# Patient Record
Sex: Male | Born: 1945 | Race: Black or African American | Hispanic: No | Smoking: Never smoker
Health system: Southern US, Community
[De-identification: ages and names within clinical notes are randomized; demographics above are authoritative.]

## PROBLEM LIST (undated history)

## (undated) DIAGNOSIS — M25511 Pain in right shoulder: Secondary | ICD-10-CM

## (undated) DIAGNOSIS — I10 Essential (primary) hypertension: Secondary | ICD-10-CM

## (undated) DIAGNOSIS — N210 Calculus in bladder: Secondary | ICD-10-CM

## (undated) DIAGNOSIS — K219 Gastro-esophageal reflux disease without esophagitis: Secondary | ICD-10-CM

## (undated) DIAGNOSIS — Z87448 Personal history of other diseases of urinary system: Secondary | ICD-10-CM

## (undated) HISTORY — PX: INGUINAL HERNIA REPAIR: SUR1180

## (undated) HISTORY — PX: OTHER SURGICAL HISTORY: SHX169

---

## 2002-01-27 ENCOUNTER — Encounter: Payer: Self-pay | Admitting: Family Medicine

## 2002-01-27 ENCOUNTER — Encounter: Admission: RE | Admit: 2002-01-27 | Discharge: 2002-01-27 | Payer: Self-pay | Admitting: Family Medicine

## 2006-07-30 ENCOUNTER — Encounter: Admission: RE | Admit: 2006-07-30 | Discharge: 2006-07-30 | Payer: Self-pay | Admitting: Occupational Medicine

## 2008-07-19 ENCOUNTER — Encounter: Admission: RE | Admit: 2008-07-19 | Discharge: 2008-07-19 | Payer: Self-pay | Admitting: Internal Medicine

## 2010-04-20 ENCOUNTER — Ambulatory Visit (HOSPITAL_BASED_OUTPATIENT_CLINIC_OR_DEPARTMENT_OTHER): Admission: RE | Admit: 2010-04-20 | Discharge: 2010-04-20 | Payer: Self-pay | Admitting: Urology

## 2010-08-28 LAB — POCT I-STAT, CHEM 8
BUN: 12 mg/dL (ref 6–23)
Calcium, Ion: 1.28 mmol/L (ref 1.12–1.32)
Chloride: 103 mEq/L (ref 96–112)
Creatinine, Ser: 1 mg/dL (ref 0.4–1.5)
Glucose, Bld: 106 mg/dL — ABNORMAL HIGH (ref 70–99)
HCT: 43 % (ref 39.0–52.0)
Hemoglobin: 14.6 g/dL (ref 13.0–17.0)
Potassium: 3.9 mEq/L (ref 3.5–5.1)
Sodium: 142 mEq/L (ref 135–145)
TCO2: 28 mmol/L (ref 0–100)

## 2011-07-19 ENCOUNTER — Other Ambulatory Visit: Payer: Self-pay | Admitting: Internal Medicine

## 2011-07-19 DIAGNOSIS — R7989 Other specified abnormal findings of blood chemistry: Secondary | ICD-10-CM

## 2011-07-26 NOTE — Telephone Encounter (Signed)
Opened in error

## 2012-12-29 ENCOUNTER — Other Ambulatory Visit: Payer: Self-pay | Admitting: Occupational Medicine

## 2012-12-29 ENCOUNTER — Ambulatory Visit: Payer: Self-pay

## 2012-12-29 DIAGNOSIS — Z021 Encounter for pre-employment examination: Secondary | ICD-10-CM

## 2013-06-17 HISTORY — PX: KNEE ARTHROSCOPY: SUR90

## 2014-09-04 IMAGING — CR DG CHEST 2V
2 series · 2 of 2 positions shown · non-contrast
Comparison: 07/19/2008; 07/30/2006

CLINICAL DATA: Physical examination, nonsmoker

CHEST - 2 VIEW

[view not recorded (1 of 2)]
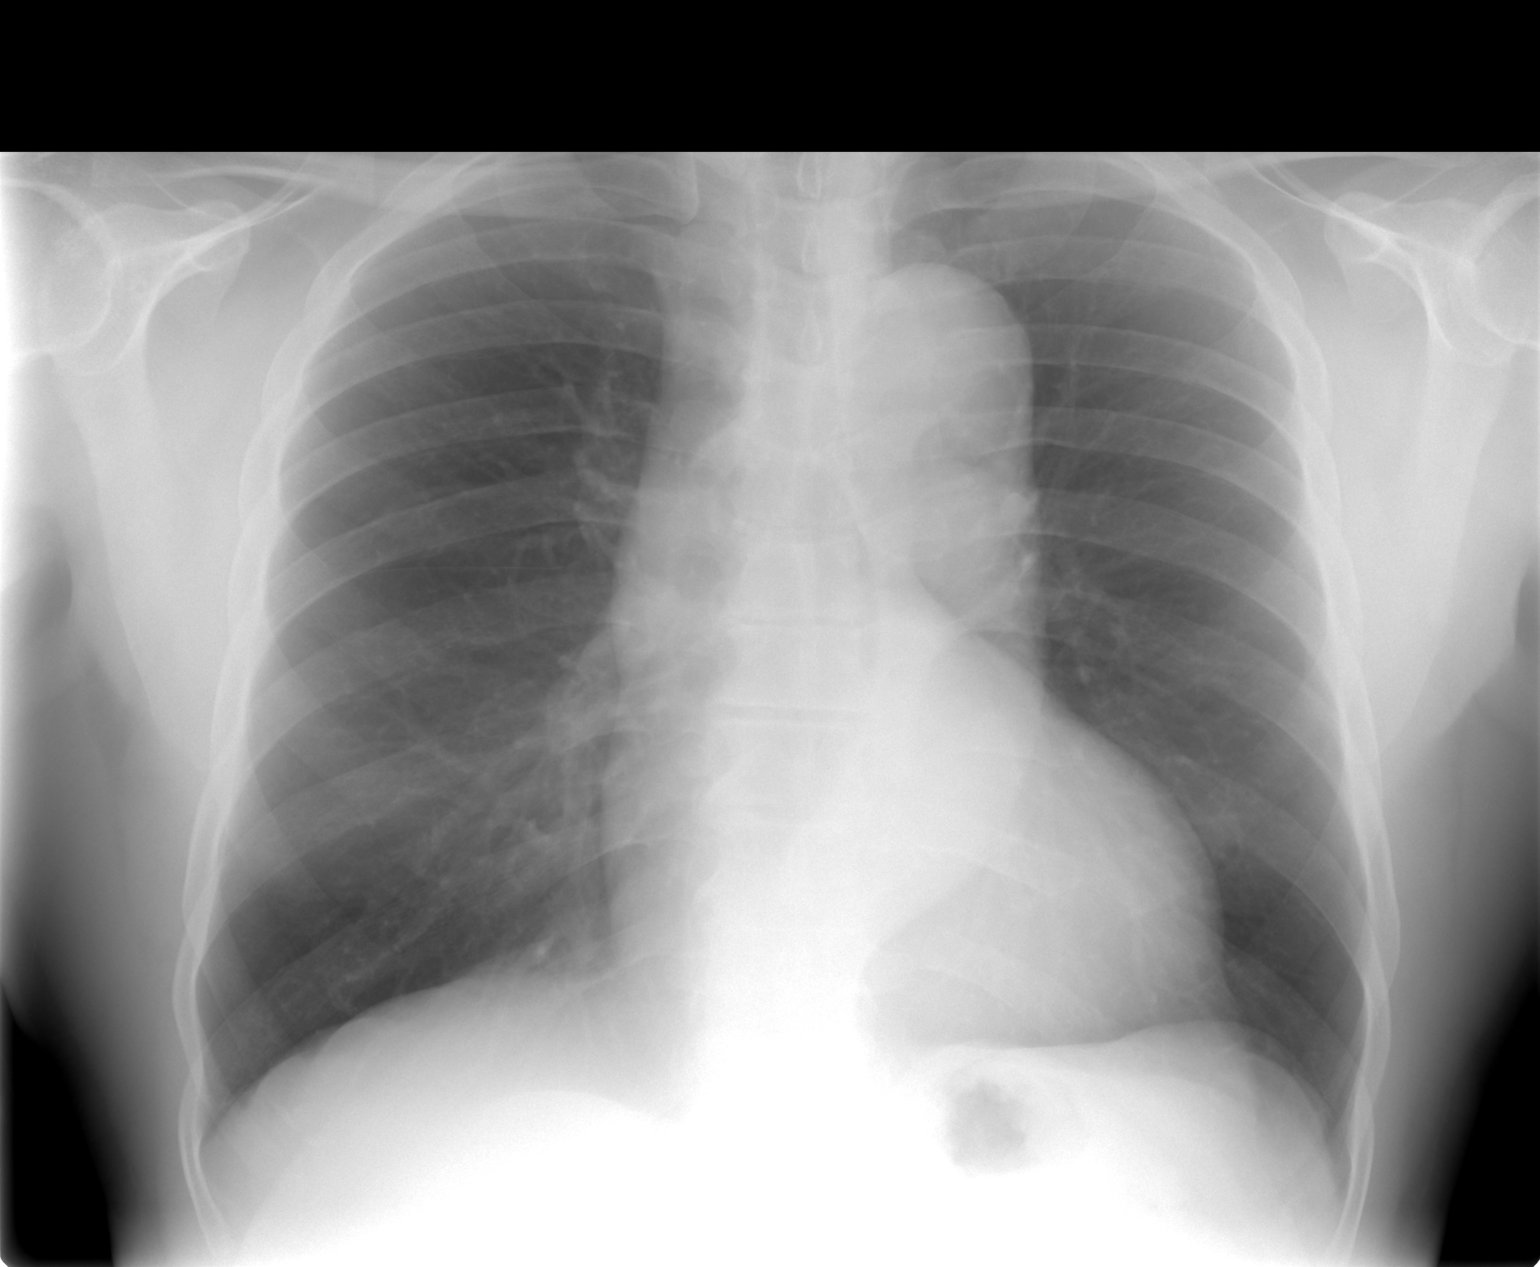

[view not recorded (2 of 2)]
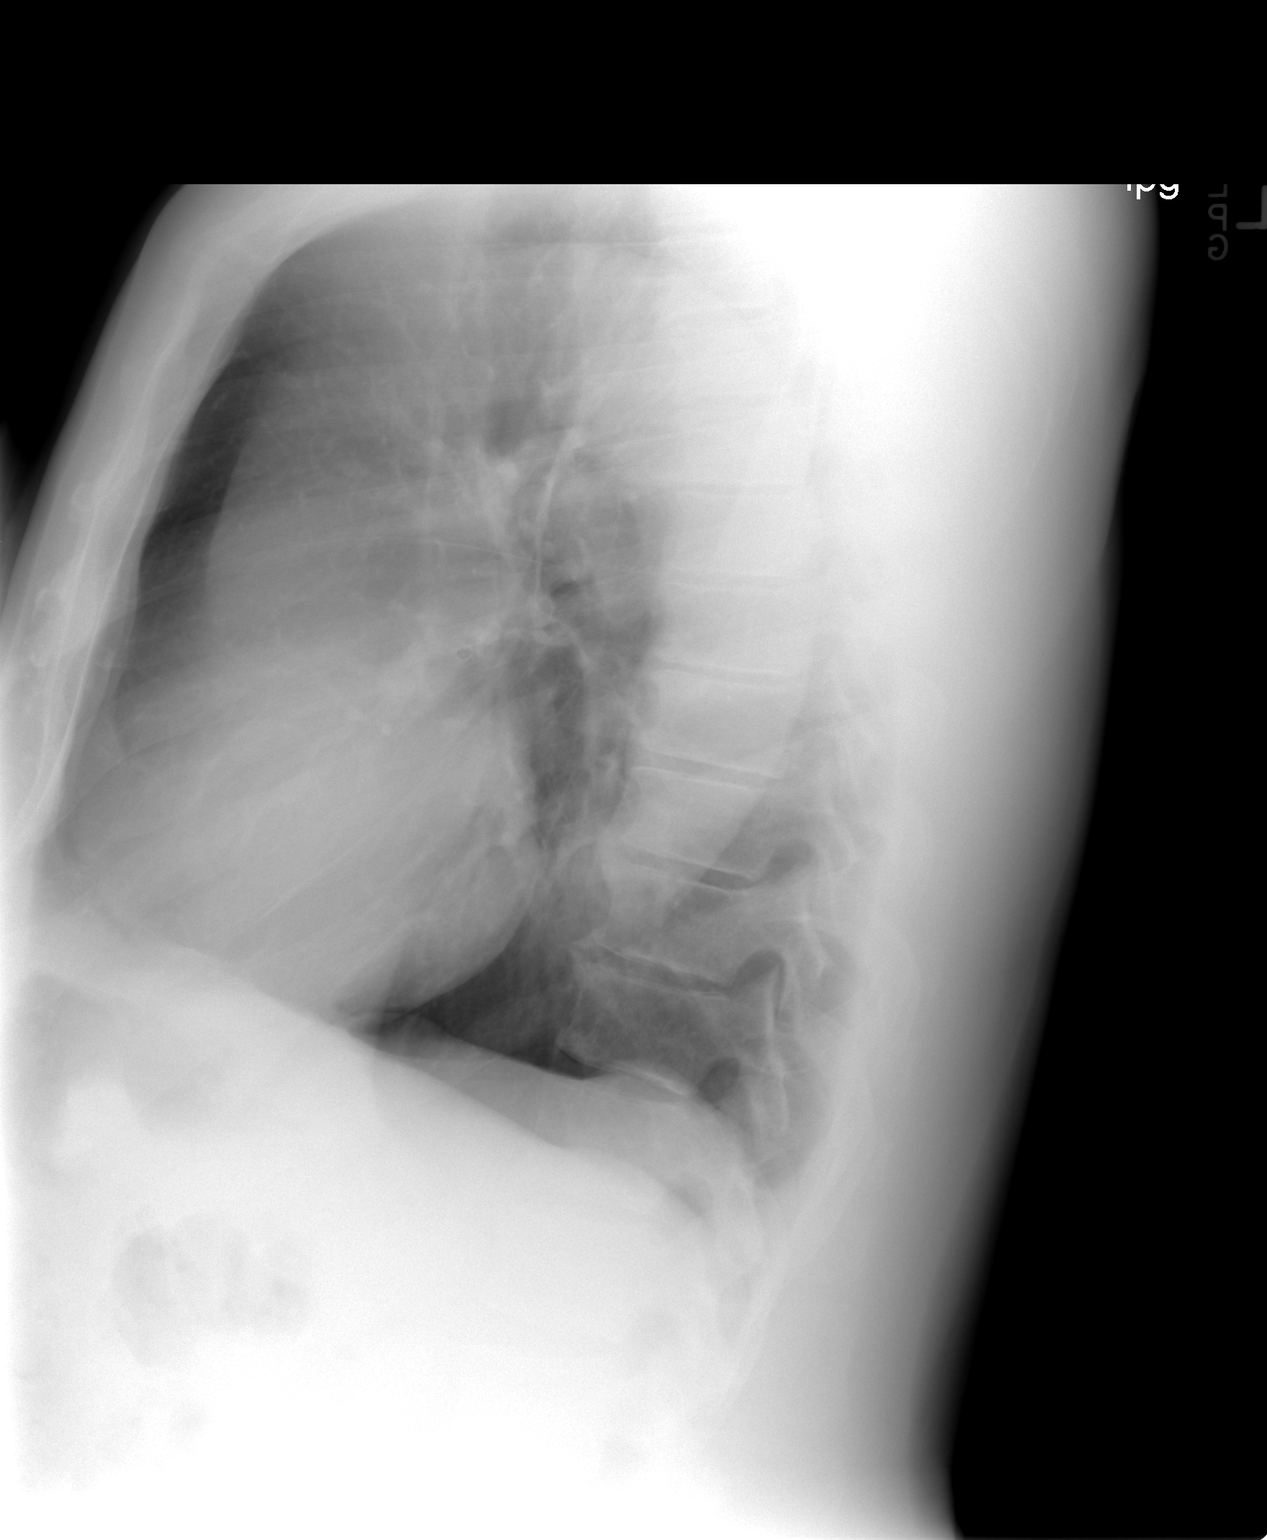

[2 of 2 positions shown; findings below may reference images not displayed]

FINDINGS: Grossly unchanged borderline enlarged cardiac silhouette.
There is grossly unchanged suspected at least moderate ectasia and
tortuosity of the thoracic aorta.

The lungs appear mildly hyperexpanded with flattening of bilateral
hemidiaphragms.  There is mild eventration of the medial aspect of
the right hemidiaphragm and lateral aspect of the left
hemidiaphragm.  No pleural effusion or pneumothorax.  No evidence
of edema.  Unchanged bones.
IMPRESSION: 1.  Grossly unchanged findings of mild cardiomegaly and at least
moderate ectasia and tortuosity of the thoracic aorta.  Further
evaluation for thoracic aortic aneurysm may be performed with a
chest CT as clinically indicated.

2.  Mild lung hyperexpansion without acute cardiopulmonary disease.

These results will be called to the ordering clinician or
representative by the Radiologist Assistant, and communication
documented in the PACS Dashboard.

## 2016-01-16 ENCOUNTER — Emergency Department (HOSPITAL_COMMUNITY)
Admission: EM | Admit: 2016-01-16 | Discharge: 2016-01-16 | Disposition: A | Discharge status: Home / Self Care | Payer: Medicare Other | Attending: Emergency Medicine | Admitting: Emergency Medicine

## 2016-01-16 ENCOUNTER — Encounter (HOSPITAL_COMMUNITY): Payer: Self-pay | Admitting: Emergency Medicine

## 2016-01-16 DIAGNOSIS — T783XXA Angioneurotic edema, initial encounter: Secondary | ICD-10-CM | POA: Insufficient documentation

## 2016-01-16 DIAGNOSIS — I10 Essential (primary) hypertension: Secondary | ICD-10-CM | POA: Insufficient documentation

## 2016-01-16 DIAGNOSIS — Z79899 Other long term (current) drug therapy: Secondary | ICD-10-CM | POA: Insufficient documentation

## 2016-01-16 DIAGNOSIS — Z7982 Long term (current) use of aspirin: Secondary | ICD-10-CM | POA: Diagnosis not present

## 2016-01-16 HISTORY — DX: Essential (primary) hypertension: I10

## 2016-01-16 MED ORDER — FAMOTIDINE IN NACL 20-0.9 MG/50ML-% IV SOLN
20.0000 mg | Freq: Once | INTRAVENOUS | Status: AC
  Administered 2016-01-16: 20 mg via INTRAVENOUS
  Filled 2016-01-16: qty 50

## 2016-01-16 MED ORDER — FAMOTIDINE 20 MG PO TABS: 20.0000 mg | ORAL_TABLET | Freq: Two times a day (BID) | ORAL | 0 refills | Status: AC

## 2016-01-16 MED ORDER — DIPHENHYDRAMINE HCL 50 MG/ML IJ SOLN
25.0000 mg | Freq: Once | INTRAMUSCULAR | Status: AC
  Administered 2016-01-16: 25 mg via INTRAVENOUS
  Filled 2016-01-16: qty 1

## 2016-01-16 MED ORDER — PREDNISONE 20 MG PO TABS: 40.0000 mg | ORAL_TABLET | Freq: Every day | ORAL | 0 refills | Status: DC

## 2016-01-16 MED ORDER — METHYLPREDNISOLONE SODIUM SUCC 125 MG IJ SOLR
125.0000 mg | Freq: Once | INTRAMUSCULAR | Status: AC
  Administered 2016-01-16: 125 mg via INTRAVENOUS
  Filled 2016-01-16: qty 2

## 2016-01-16 NOTE — ED Triage Notes (Signed)
Pt states he woke up this morning and his tongue was swollen  Pt states he does take lisinopril but did not take his dose this morning  Pt states he took benadryl 1 tab this morning prior to coming in  Denies difficulty breathing but states it is a little hard to swallow

## 2016-01-16 NOTE — Discharge Instructions (Signed)
[facility].  Do not take lisinopril.  Please place on your allergy list.

## 2016-01-16 NOTE — ED Provider Notes (Signed)
9:58 AM Patient continues to feel good.  Speaking in full sentences.  Mild ongoing swelling of the tongue.  Patient and family reports that it is not increased in size.  Patient stable for discharge.  Patient be discharged home with prednisone and Pepcid.  I've recommended discontinuation of lisinopril.  Patient is heading to the [facility] at this time for additional recommendations in regards to his blood pressure medications.   Azalia Bilis, MD 01/16/16 [phone]

## 2016-01-16 NOTE — ED Provider Notes (Addendum)
[facility]-EMERGENCY DEPT Provider Note   CSN: [id number] Arrival date & time: 01/16/16  0601  First Provider Contact:  None       History   Chief Complaint Chief Complaint  Patient presents with  . Angioedema    HPI Bryan Williams is a 70 y.o. male.  The history is provided by the patient.  He woke up this morning and noted that his tongue was swollen. There is no difficulty breathing or swallowing. He does have history of hypertension and is on lisinopril. He has been taking that for several years and has not had nay problems prior to this. He's never had any similar episodes in the past. He denies any pain or itching. There is no rash.  Past Medical History:  Diagnosis Date  . Hypertension     There are no active problems to display for this patient.   Past Surgical History:  Procedure Laterality Date  . HERNIA REPAIR         Home Medications    Prior to Admission medications   Not on File    Family History Family History  Problem Relation Age of Onset  . Hypertension Mother   . Hypertension Father   . Diabetes Father     Social History Social History  Substance Use Topics  . Smoking status: Never Smoker  . Smokeless tobacco: Never Used  . Alcohol use Yes     Allergies   Review of patient's allergies indicates no known allergies.   Review of Systems Review of Systems  All other systems reviewed and are negative.    Physical Exam Updated Vital Signs BP 165/97 (BP Location: Left Arm)   Pulse 67   Temp 98.4 F (36.9 C) (Oral)   Resp 16   Ht 5\' 8"  (1.727 m)   Wt 182 lb (82.6 kg)   SpO2 100%   BMI 27.67 kg/m   Physical Exam  Nursing note and vitals reviewed.  70 year old male, resting comfortably and in no acute distress. Vital signs are significant for hypertension. Oxygen saturation is 100%, which is normal. Head is normocephalic and atraumatic. PERRLA, EOMI. Oropharynx shows moderate angioedema of the tongue and sublingual tissue. There  is no edema noted of the uvula or soft palate. Speech is slightly thick of. He has no difficulty with secretions. There is no stridor. Neck is nontender and supple without adenopathy or JVD. Back is nontender and there is no CVA tenderness. Lungs are clear without rales, wheezes, or rhonchi. Chest is nontender. Heart has regular rate and rhythm without murmur. Abdomen is soft, flat, nontender without masses or hepatosplenomegaly and peristalsis is normoactive. Extremities have no cyanosis or edema, full range of motion is present. Skin is warm and dry without rash. Neurologic: Mental status is normal, cranial nerves are intact, there are no motor or sensory deficits.  ED Treatments / Results  Labs (all labs ordered are listed, but only abnormal results are displayed) Labs Reviewed - No data to display  EKG  EKG Interpretation None       Radiology No results found.  Procedures Procedures (including critical care time) CRITICAL CARE Performed by: UTMLY,YTKPT Total critical care time: 60 minutes Critical care time was exclusive of separately billable procedures and treating other patients. Critical care was necessary to treat or prevent imminent or life-threatening deterioration. Critical care was time spent personally by me on the following activities: development of treatment plan with patient and/or surrogate as well as nursing, discussions with  consultants, evaluation of patient's response to treatment, examination of patient, obtaining history from patient or surrogate, ordering and performing treatments and interventions, ordering and review of laboratory studies, ordering and review of radiographic studies, pulse oximetry and re-evaluation of patient's condition.  Medications Ordered in ED Medications - No data to display   Initial Impression / Assessment and Plan / ED Course  I have reviewed the triage vital signs and the nursing notes.  Pertinent labs & imaging results  that were available during my care of the patient were reviewed by me and considered in my medical decision making (see chart for details).  Clinical Course    Angioedema of the tongue secondary to ACE inhibitor. Old records are reviewed, and he has no relevant past visits. He is given methylprednisolone, diphenhydramine, famotidine and will be observed in the ED.   0700 Minimal improvement Case is signed out to Dr. Patria Mane.  Final Clinical Impressions(s) / ED Diagnoses   Final diagnoses:  Angioedema, initial encounter    New Prescriptions New Prescriptions   No medications on file     Dione Booze, MD 01/17/16 1506    Dione Booze, MD 01/17/16 [phone]

## 2016-01-27 ENCOUNTER — Encounter (HOSPITAL_COMMUNITY): Payer: Self-pay | Admitting: Emergency Medicine

## 2016-01-27 ENCOUNTER — Emergency Department (HOSPITAL_COMMUNITY): Payer: Medicare Other

## 2016-01-27 ENCOUNTER — Emergency Department (HOSPITAL_COMMUNITY)
Admission: EM | Admit: 2016-01-27 | Discharge: 2016-01-27 | Disposition: A | Discharge status: Home / Self Care | Payer: Medicare Other | Attending: Emergency Medicine | Admitting: Emergency Medicine

## 2016-01-27 DIAGNOSIS — I10 Essential (primary) hypertension: Secondary | ICD-10-CM | POA: Insufficient documentation

## 2016-01-27 DIAGNOSIS — M79601 Pain in right arm: Secondary | ICD-10-CM | POA: Diagnosis present

## 2016-01-27 DIAGNOSIS — S46211A Strain of muscle, fascia and tendon of other parts of biceps, right arm, initial encounter: Secondary | ICD-10-CM

## 2016-01-27 DIAGNOSIS — S46001A Unspecified injury of muscle(s) and tendon(s) of the rotator cuff of right shoulder, initial encounter: Secondary | ICD-10-CM

## 2016-01-27 DIAGNOSIS — Z7982 Long term (current) use of aspirin: Secondary | ICD-10-CM | POA: Insufficient documentation

## 2016-01-27 DIAGNOSIS — Z79899 Other long term (current) drug therapy: Secondary | ICD-10-CM | POA: Insufficient documentation

## 2016-01-27 DIAGNOSIS — M75101 Unspecified rotator cuff tear or rupture of right shoulder, not specified as traumatic: Secondary | ICD-10-CM | POA: Insufficient documentation

## 2016-01-27 DIAGNOSIS — M66821 Spontaneous rupture of other tendons, right upper arm: Secondary | ICD-10-CM | POA: Diagnosis not present

## 2016-01-27 MED ORDER — KETOROLAC TROMETHAMINE 60 MG/2ML IM SOLN
60.0000 mg | Freq: Once | INTRAMUSCULAR | Status: AC
  Administered 2016-01-27: 60 mg via INTRAMUSCULAR
  Filled 2016-01-27: qty 2

## 2016-01-27 MED ORDER — NAPROXEN 375 MG PO TABS: 375.0000 mg | ORAL_TABLET | Freq: Two times a day (BID) | ORAL | 0 refills | Status: DC

## 2016-01-27 MED ORDER — HYDROCODONE-ACETAMINOPHEN 5-325 MG PO TABS
2.0000 | ORAL_TABLET | Freq: Once | ORAL | Status: AC
  Administered 2016-01-27: 2 via ORAL
  Filled 2016-01-27: qty 2

## 2016-01-27 NOTE — ED Provider Notes (Addendum)
[facility]ERGENC[facility] DEPT Provider Note   CSN: [id number] Arrival date & time: 01/27/16  1821  First Provider Contact:  First MD Initiated Contact with Patient 01/27/16 1926        History   Chief Complaint Chief Complaint  Patient presents with  . Arm Pain    HPI Bryan Williams is a 70 y.o. male.  HPI 70 yo M with PMHx of HTN who p/w a 1-week history of right arm pain. Pt denies any direct injury but does admit to frequent use of his arm in the yard. Over the last week, he noticed initially acute and now gradually worsening onset of right arm pain. He describes the pain as an aching, throbbing sensation worse in his shoulder and upper arm. Over the past day, he has had difficulty flexing his arm or raising his arm due to pain and weakness. He subsequently presents for evaluation. Pain is made worse with movement and palpation. Denies any alleviating factors.  Past Medical History:  Diagnosis Date  . Hypertension     There are no active problems to display for this patient.   Past Surgical History:  Procedure Laterality Date  . HERNIA REPAIR         Home Medications    Prior to Admission medications   Medication Sig Start Date End Date Taking? Authorizing Provider  aspirin EC 81 MG tablet Take 81 mg by mouth daily.   Yes Historical Provider, MD  Cholecalciferol (VITAMIN D PO) Take 1 tablet by mouth daily.    Yes Historical Provider, MD  famotidine (PEPCID) 20 MG tablet Take 1 tablet (20 mg total) by mouth 2 (two) times daily. 01/16/16  Yes Azalia Bilis, MD  GARLIC PO Take 1 capsule by mouth daily.    Yes Historical Provider, MD  Omega-3 Fatty Acids (FISH OIL PO) Take 1 capsule by mouth daily.    Yes Historical Provider, MD  naproxen (NAPROSYN) 375 MG tablet Take 1 tablet (375 mg total) by mouth 2 (two) times daily. 01/27/16   Shaune Pollack, MD  predniSONE (DELTASONE) 20 MG tablet Take 2 tablets (40 mg total) by mouth daily. Patient not taking: Reported on 01/27/2016 01/16/16    Azalia Bilis, MD    Family History Family History  Problem Relation Age of Onset  . Hypertension Mother   . Hypertension Father   . Diabetes Father     Social History Social History  Substance Use Topics  . Smoking status: Never Smoker  . Smokeless tobacco: Never Used  . Alcohol use Yes     Allergies   Lisinopril   Review of Systems Review of Systems  Constitutional: Negative for chills, fatigue and fever.  HENT: Negative for congestion and rhinorrhea.   Eyes: Negative for visual disturbance.  Respiratory: Negative for cough, shortness of breath and wheezing.   Cardiovascular: Negative for chest pain and leg swelling.  Gastrointestinal: Negative for abdominal pain, diarrhea, nausea and vomiting.  Genitourinary: Negative for dysuria and flank pain.  Musculoskeletal: Negative for neck pain and neck stiffness.  Skin: Negative for rash and wound.  Allergic/Immunologic: Negative for immunocompromised state.  Neurological: Negative for syncope, weakness and headaches.  All other systems reviewed and are negative.     Physical Exam Updated Vital Signs BP (!) 176/104 (BP Location: Left Arm)   Pulse 64   Temp 98.6 F (37 C) (Oral)   Resp 14   Ht  (1.727 m)   Wt 187 lb (84.8 kg)   SpO2 99%  BMI 28.43 kg/m   Physical Exam  Constitutional: He is oriented to person, place, and time. He appears well-developed and well-nourished. No distress.  HENT:  Head: Normocephalic and atraumatic.  Eyes: Conjunctivae are normal.  Neck: Neck supple.  Cardiovascular: Normal rate, regular rhythm and normal heart sounds.   Pulmonary/Chest: Effort normal. No respiratory distress. He has no wheezes.  Abdominal: He exhibits no distension.  Musculoskeletal: He exhibits no edema.  Neurological: He is alert and oriented to person, place, and time. He exhibits normal muscle tone.  Skin: Skin is warm. Capillary refill takes less than 2 seconds. No rash noted.  Nursing note and  vitals reviewed.   UPPER EXTREMITY EXAM: RIGHT  INSPECTION & PALPATION: Soft tissue swelling over mid and proximal biceps with TTP over proximal bicipital groove. Distal biceps tendon is intact to palpation and pt able to supinate/pronate at elbow. On ROM testing, pt with difficult flexing at shoulder and flexion at elbow. No impingement. Able to externally and internally rotate humerus at shoulder. No open wounds.  SENSORY: Sensation is intact to light touch in:  Superficial radial nerve distribution (dorsal first web space) Median nerve distribution (tip of index finger)   Ulnar nerve distribution (tip of small finger)     MOTOR:  + Motor posterior interosseous nerve (thumb IP extension) + Anterior interosseous nerve (thumb IP flexion, index finger DIP flexion) + Radial nerve (wrist extension) + Median nerve (palpable firing thenar mass) + Ulnar nerve (palpable firing of first dorsal interosseous muscle)  VASCULAR: 2+ radial pulse Brisk capillary refill < 2 sec, fingers warm and well-perfused  ED Treatments / Results  Labs (all labs ordered are listed, but only abnormal results are displayed) Labs Reviewed - No data to display  EKG  EKG Interpretation None       Radiology Dg Shoulder Right  Result Date: 01/27/2016 CLINICAL DATA:  Proximal right humeral pain for 1 week, no known injury, initial encounter EXAM: RIGHT SHOULDER - 2+ VIEW COMPARISON:  None. FINDINGS: Degenerative changes of the acromioclavicular joint are noted. The humeral head is high-riding which may be related to an under line rotator cuff injury. Bony thorax is within normal limits. IMPRESSION: No acute fracture or dislocation is noted. Humeral head is somewhat high-riding which may be related underlying rotator cuff injury. Electronically Signed   By: Alcide CleverMark  Lukens M.D.   On: 01/27/2016 20:19    Procedures Procedures (including critical care time)  Medications Ordered in ED Medications    HYDROcodone-acetaminophen (NORCO/VICODIN) 5-325 MG per tablet 2 tablet (2 tablets Oral Given 01/27/16 2032)  ketorolac (TORADOL) injection 60 mg (60 mg Intramuscular Given 01/27/16 2032)     Initial Impression / Assessment and Plan / ED Course  I have reviewed the triage vital signs and the nursing notes.  Pertinent labs & imaging results that were available during my care of the patient were reviewed by me and considered in my medical decision making (see chart for details).  Clinical Course   70 yo M with PMHx of HTN who presents with right arm pain and limitation in ROM for 1 week. No direct trauma. Exam is as above and is concerning for partial versus complete proximal biceps tendon rupture. Distal biceps tendon is intact on palpation and ROM testing. DDx includes rotator cuff injury though no impingement and internal/external rotation is largely WNL. No signs of distal NV compromise. Plain films show no fx but high-riding humerus c/w rotator cuff injury. Will place in splint for comfort but  advised ROM to prevent frozen shoulder, and d/c with outpt Ortho f/u this week. NSAIDs for pain.  Of note, pt markedly hypertensive here. He has no HA, vision changes, CP, SOB, or signs of HTN urgency. Per review of records, pt has h/o poorly-controlled HTN. He will f/u with his PCP re: this.  Final Clinical Impressions(s) / ED Diagnoses   Final diagnoses:  Biceps rupture, proximal, right, initial encounter  Rotator cuff injury, right, initial encounter    New Prescriptions Discharge Medication List as of 01/27/2016  8:44 PM    START taking these medications   Details  naproxen (NAPROSYN) 375 MG tablet Take 1 tablet (375 mg total) by mouth 2 (two) times daily., Starting Sat 01/27/2016, Print         Shaune Pollack, MD 01/28/16 4098    Shaune Pollack, MD 01/28/16 [phone]

## 2016-01-27 NOTE — ED Triage Notes (Addendum)
Patient c/o right upper arm pain x week.  Pain worse with movement.  Patient states that arm feels weak at times and pain is throbbing.  Patient denies any lifting moving, or injury.  Patient has equal and strong grips.

## 2016-01-27 NOTE — Discharge Instructions (Signed)
Wear your sling for comfort, but it is important to make sure you move your shoulder through a full range of motion frequently in order to prevent frozen shoulder

## 2016-02-29 ENCOUNTER — Other Ambulatory Visit: Payer: Self-pay | Admitting: Urology

## 2016-03-07 ENCOUNTER — Encounter (HOSPITAL_BASED_OUTPATIENT_CLINIC_OR_DEPARTMENT_OTHER): Payer: Self-pay | Admitting: *Deleted

## 2016-03-07 NOTE — Progress Notes (Signed)
NPO AFTER MN.  ARRIVE AT 0700.  NEED ISTAT AND EKG.  WILL TAKE PEPCID AND NORVASC AM DOS W/ SIPS OF WATER.

## 2016-03-08 NOTE — H&P (Signed)
HPI: Bryan Williams is a 70 year-old male patient who was referred by Dr. Delorse Lek, MD who was seen for evaluation of BPH and lower tract symptoms.  The patient complains of lower urinary tract symptom(s) that include weak stream, intermittency, and nocturia. The patient states his most bothersome symptom(s) are the following: nocturia.   He has nocturia 1 time per night. He does have to wait a long time to start his urinary stream. His urinary stream does start and stop during voiding. He feels that he does not empty his bladder.   He does have some mild obstructive symptoms but they are markedly improved since he started medication. He is currently taking both tamsulosin and finasteride. He said he now only gets up once a night where he was getting up about every hour. The force has improved as well. He denies any dysuria or hematuria.     CC: I have bladder stones.  HPI: His problem was diagnosed 04/18/2010. [facility] diagnosis [facility] by [facility]. He has had no symptoms.   He has not had prior urinary tract or prostate infections.   He does not have urgency. He does not have frequency. He does not have trouble emptying his bladder at this time. He does not have pain or burning when he urinates.   He has not previously had an indwelling catheter in for more than two weeks at a time. He has had no prostate surgery.   He told me that his renal ultrasound was obtained because of an elevated PSA of 5. He has not experienced any hematuria. His voiding symptoms have improved since he started medical management.     ALLERGIES: No Allergies    MEDICATIONS: Daily Multivitamin PO Daily  Finasteride 5 mg tablet 1 tablet PO Daily  Tamsulosin Hcl 0.4 mg capsule, ext release 24 hr 1 capsule PO Daily  Amlodipine Besylate 10 mg tablet     GU PSH: Cysto Bladder Stone >2.5cm - Oct 27, 2009 Cysto Dilate Stricture (M or F) 10-27-09 Hernia Repair 10-27-2009      PSH Notes: Cystoscopy For Urethral Stricture,  Cystoscopy With Fragmentation Of Bladder Calculus Over 2.5cm, Hernia Repair   NON-GU PSH: None   GU PMH: Bladder Stone, Bladder calculus - Oct 27, 2012 BPH w/LUTS, Benign prostatic hyperplasia with urinary obstruction - 10-27-2012 Elevated PSA, Elevated prostate specific antigen (PSA) - 10-27-2012 Inflammatory Disease Prostate, Unspec, Prostatitis - 27-Oct-2012 Other microscopic hematuria, Microscopic hematuria - 10-27-12 Urethral Stricture, Unspec, Urethral stricture - 10/27/12 Urinary Tract Inf, Unspec site, Pyuria - 2012/10/27      PMH Notes: History of elevated PSA: His PSA was found to be 5.13 in 8/11. His urine appeared infected at the time and he was treated with antibiotics with a fall of his PSA back to normal.   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 Vonita Moss.   Bladder calculus: He was found to have a 3 cm bladder stone and underwent cystolitholapaxy in 11/11.Marland Kitchen   NON-GU PMH: Personal history of other diseases of the circulatory system, History of hypertension - Oct 27, 2012    FAMILY HISTORY: Death In The Family Father - Runs In Family Family Health Status - Mother's Age - Runs In Family Family Health Status Number - Runs In Family   SOCIAL HISTORY: Marital Status: Single Current Smoking Status: Patient has never smoked.  Social Drinker.  Drinks 4+ caffeinated drinks per day.  Notes: Tobacco Use, Alcohol Use, Occupation:, Caffeine Use, Marital History - Single   REVIEW OF SYSTEMS:    GU Review Male:   Patient reports frequent urination, have to strain to urinate , stream starts and stops, hard to postpone urination, leakage of urine, and get up at night to urinate. Patient denies erection problems, penile pain, burning/ pain with urination, and trouble starting your stream.  Gastrointestinal (Upper):   Patient denies nausea, vomiting, and indigestion/ heartburn.  Gastrointestinal (Lower):   Patient denies diarrhea and  constipation.  Constitutional:   Patient denies fever, night sweats, weight loss, and fatigue.  Skin:   Patient denies skin rash/ lesion and itching.  Eyes:   Patient reports blurred vision. Patient denies double vision.  Ears/ Nose/ Throat:   Patient denies sore throat and sinus problems.  Hematologic/Lymphatic:   Patient denies swollen glands and easy bruising.  Cardiovascular:   Patient denies leg swelling and chest pains.  Respiratory:   Patient denies cough and shortness of breath.  Endocrine:   Patient denies excessive thirst.  Musculoskeletal:   Patient reports joint pain. Patient denies back pain.  Neurological:   Patient denies headaches and dizziness.  Psychologic:   Patient denies depression and anxiety.   VITAL SIGNS:    Weight 185 lb / 83.91 kg  Height 68 in / 172.72 cm  BP 158/92 mmHg  Pulse 72 /min  BMI 28.1 kg/m   GU PHYSICAL EXAMINATION:    Anus and Perineum: No hemorrhoids. No anal stenosis. No rectal fissure, no anal fissure. No edema, no dimple, no perineal tenderness, no anal tenderness.  Scrotum: No lesions. No edema. No cysts. No warts.  Epididymides: Right: no spermatocele, no masses, no cysts, no tenderness, no induration, no enlargement. Left: no spermatocele, no masses, no cysts, no tenderness, no induration, no enlargement.  Testes: No tenderness, no swelling, no enlargement left testes. No tenderness, no swelling, no enlargement right testes. Normal location left testes. Normal location right testes. No mass, no cyst, no varicocele, no hydrocele left testes. No mass, no cyst, no varicocele, no hydrocele right testes.  Urethral Meatus: Normal size. No lesion, no wart, no discharge, no polyp. Normal location.  Penis: Circumcised, no warts, no cracks. No dorsal Peyronie's plaques, no left corporal Peyronie's plaques, no right corporal Peyronie's plaques, no scarring, no warts. No balanitis, no meatal stenosis.  Prostate: 40 gram or 2+ size. Left lobe normal  consistency, right lobe normal consistency. Symmetrical lobes. No prostate nodule. Left lobe no tenderness, right lobe no tenderness.  Seminal Vesicles: Nonpalpable.  Sphincter Tone: Normal sphincter. No rectal tenderness. No rectal mass.    MULTI-SYSTEM PHYSICAL EXAMINATION:    Constitutional: Well-nourished. No physical deformities. Normally developed. Good grooming.  Neck: Neck symmetrical, not swollen. Normal tracheal position.  Respiratory: No labored breathing, no use of accessory muscles.   Cardiovascular: Normal temperature, normal extremity pulses, no swelling, no varicosities.  Lymphatic: No enlargement of neck, axillae, groin.  Skin: No paleness, no jaundice, no cyanosis. No lesion, no ulcer, no rash.  Neurologic / Psychiatric: Oriented to time, oriented to place, oriented to person. No depression, no anxiety, no agitation.  Gastrointestinal: No mass, no tenderness, no rigidity, non obese abdomen.  Eyes: Normal conjunctivae. Normal eyelids.  Ears, Nose, Mouth, and Throat: Left ear no scars, no lesions, no masses. Right ear no scars, no lesions, no masses. Nose no scars, no lesions, no masses. Normal hearing. Normal lips.  Musculoskeletal: Normal gait and station of head and neck.  PAST DATA REVIEWED:  Source Of History:  Patient, Outside Source  Records Review:   Previous Hospital Records, Previous Patient Records, POC Tool  X-Ray Review: Renal Ultrasound: Reviewed Films. His renal ultrasound on 02/13/16 revealed normal kidneys bilaterally with no evidence of stones or hydro, a moderately distended bladderWith a prevoid volume of 406 cc and a postvoid volume of 215 cc as well as an enlarged prostate and a 1.6 cm bladder stone. C.T. Abdomen/Pelvis: Reviewed Films. His CT scan in 11/11 revealed a 2.7 mm stone in the bladder.    03/08/10  PSA  Total PSA 1.78    ASSESSMENT:      ICD-10 Details  1 GU:   Bladder Stone - N21.0 He has a moderate size bladder stone. We discussed the  reason why bladder stones typically develop and he was carrying an elevated residual before he started medical management. He said he is urinating much better now. We discussed surgical treatment of his stone which is his desire because he was [facility] the stone. He has undergone this procedure before but we discussed today in detail going over the probability of success, the outpatient nature of the procedure, however was performed and the anticipated postoperative course. He understands and is elected to proceed.  2   Incomplete bladder emptying - R39.14 It appears he was not emptying his bladder well and we did discuss his voiding symptoms but he has noted fairly significant improvement since starting tamsulosin and finasteride.  3   BPH w/LUTS - N40.1 He was not emptying his bladder completely at the time of his ultrasound but he was not taking his medication at that time. We did discuss transurethral resection of the prostate as an option but he did not want to consider that he said he just wanted his stone taken care of.  4   Elevated PSA - R97.20 He told me that his PSA had increased from 2 to 5. He indicated that he is being followed for this and this is being [facility].   PLAN:   Cystolitholapaxy

## 2016-03-10 NOTE — Anesthesia Preprocedure Evaluation (Addendum)
Anesthesia Evaluation  Patient identified by MRN, date of birth, ID band Patient awake    Reviewed: Allergy & Precautions, NPO status , Patient's Chart, lab work & pertinent test results  History of Anesthesia Complications Negative for: history of anesthetic complications  Airway Mallampati: II  TM Distance: >3 FB Neck ROM: Full    Dental  (+) Teeth Intact, Dental Advisory Given   Pulmonary neg pulmonary ROS,    breath sounds clear to auscultation       Cardiovascular hypertension, + Valvular Problems/Murmurs  Rhythm:Regular Rate:Normal + Systolic murmurs and + Systolic Click    Neuro/Psych negative neurological ROS     GI/Hepatic Neg liver ROS, GERD  ,  Endo/Other  negative endocrine ROS  Renal/GU Renal disease     Musculoskeletal   Abdominal   Peds  Hematology negative hematology ROS (+)   Anesthesia Other Findings   Reproductive/Obstetrics                            Anesthesia Physical Anesthesia Plan  ASA: II  Anesthesia Plan: General   Post-op Pain Management:    Induction: Intravenous  Airway Management Planned: LMA  Additional Equipment:   Intra-op Plan:   Post-operative Plan:   Informed Consent: I have reviewed the patients History and Physical, chart, labs and discussed the procedure including the risks, benefits and alternatives for the proposed anesthesia with the patient or authorized representative who has indicated his/her understanding and acceptance.   Dental advisory given  Plan Discussed with: CRNA  Anesthesia Plan Comments:         Anesthesia Quick Evaluation

## 2016-03-11 ENCOUNTER — Ambulatory Visit (HOSPITAL_BASED_OUTPATIENT_CLINIC_OR_DEPARTMENT_OTHER): Payer: Medicare Other | Admitting: Anesthesiology

## 2016-03-11 ENCOUNTER — Ambulatory Visit (HOSPITAL_BASED_OUTPATIENT_CLINIC_OR_DEPARTMENT_OTHER)
Admission: RE | Admit: 2016-03-11 | Discharge: 2016-03-11 | Disposition: A | Discharge status: Home / Self Care | Payer: Medicare Other | Source: Ambulatory Visit | Attending: Urology | Admitting: Urology

## 2016-03-11 ENCOUNTER — Encounter (HOSPITAL_BASED_OUTPATIENT_CLINIC_OR_DEPARTMENT_OTHER): Payer: Self-pay

## 2016-03-11 ENCOUNTER — Encounter (HOSPITAL_BASED_OUTPATIENT_CLINIC_OR_DEPARTMENT_OTHER)
Admission: RE | Disposition: A | Discharge status: Home / Self Care | Payer: Self-pay | Source: Ambulatory Visit | Attending: Urology

## 2016-03-11 DIAGNOSIS — N359 Urethral stricture, unspecified: Secondary | ICD-10-CM | POA: Insufficient documentation

## 2016-03-11 DIAGNOSIS — N323 Diverticulum of bladder: Secondary | ICD-10-CM | POA: Insufficient documentation

## 2016-03-11 DIAGNOSIS — Z7982 Long term (current) use of aspirin: Secondary | ICD-10-CM | POA: Diagnosis not present

## 2016-03-11 DIAGNOSIS — R972 Elevated prostate specific antigen [PSA]: Secondary | ICD-10-CM | POA: Insufficient documentation

## 2016-03-11 DIAGNOSIS — I10 Essential (primary) hypertension: Secondary | ICD-10-CM | POA: Diagnosis not present

## 2016-03-11 DIAGNOSIS — K219 Gastro-esophageal reflux disease without esophagitis: Secondary | ICD-10-CM | POA: Insufficient documentation

## 2016-03-11 DIAGNOSIS — Z79899 Other long term (current) drug therapy: Secondary | ICD-10-CM | POA: Insufficient documentation

## 2016-03-11 DIAGNOSIS — R3914 Feeling of incomplete bladder emptying: Secondary | ICD-10-CM | POA: Diagnosis not present

## 2016-03-11 DIAGNOSIS — N401 Enlarged prostate with lower urinary tract symptoms: Secondary | ICD-10-CM | POA: Insufficient documentation

## 2016-03-11 DIAGNOSIS — N210 Calculus in bladder: Secondary | ICD-10-CM | POA: Diagnosis not present

## 2016-03-11 HISTORY — DX: Personal history of other diseases of urinary system: Z87.448

## 2016-03-11 HISTORY — DX: Calculus in bladder: N21.0

## 2016-03-11 HISTORY — DX: Gastro-esophageal reflux disease without esophagitis: K21.9

## 2016-03-11 HISTORY — PX: BALLOON DILATION: SHX5330

## 2016-03-11 HISTORY — PX: HOLMIUM LASER APPLICATION: SHX5852

## 2016-03-11 HISTORY — DX: Pain in right shoulder: M25.511

## 2016-03-11 HISTORY — PX: CYSTOSCOPY WITH LITHOLAPAXY: SHX1425

## 2016-03-11 LAB — POCT I-STAT 4, (NA,K, GLUC, HGB,HCT)
Glucose, Bld: 94 mg/dL (ref 65–99)
HCT: 37 % — ABNORMAL LOW (ref 39.0–52.0)
Hemoglobin: 12.6 g/dL — ABNORMAL LOW (ref 13.0–17.0)
Potassium: 3.5 mmol/L (ref 3.5–5.1)
Sodium: 141 mmol/L (ref 135–145)

## 2016-03-11 SURGERY — CYSTOSCOPY, WITH BLADDER CALCULUS LITHOLAPAXY
Anesthesia: General | Site: Renal

## 2016-03-11 MED ORDER — EPHEDRINE 5 MG/ML INJ
INTRAVENOUS | Status: AC
  Filled 2016-03-11: qty 10

## 2016-03-11 MED ORDER — LIDOCAINE 2% (20 MG/ML) 5 ML SYRINGE
INTRAMUSCULAR | Status: DC | PRN
  Administered 2016-03-11: 80 mg via INTRAVENOUS

## 2016-03-11 MED ORDER — LIDOCAINE 2% (20 MG/ML) 5 ML SYRINGE
INTRAMUSCULAR | Status: AC
  Filled 2016-03-11: qty 5

## 2016-03-11 MED ORDER — SUCCINYLCHOLINE CHLORIDE 20 MG/ML IJ SOLN
INTRAMUSCULAR | Status: DC | PRN
  Administered 2016-03-11: 80 mg via INTRAVENOUS

## 2016-03-11 MED ORDER — PROPOFOL 10 MG/ML IV BOLUS
INTRAVENOUS | Status: AC
  Filled 2016-03-11: qty 20

## 2016-03-11 MED ORDER — PHENAZOPYRIDINE HCL 200 MG PO TABS: 200.0000 mg | ORAL_TABLET | Freq: Three times a day (TID) | ORAL | 0 refills | Status: AC | PRN

## 2016-03-11 MED ORDER — LACTATED RINGERS IV SOLN
INTRAVENOUS | Status: DC
  Administered 2016-03-11: 08:00:00 via INTRAVENOUS
  Filled 2016-03-11: qty 1000

## 2016-03-11 MED ORDER — PROPOFOL 10 MG/ML IV BOLUS
INTRAVENOUS | Status: DC | PRN
  Administered 2016-03-11: 80 mg via INTRAVENOUS
  Administered 2016-03-11: 180 mg via INTRAVENOUS

## 2016-03-11 MED ORDER — ONDANSETRON HCL 4 MG/2ML IJ SOLN
INTRAMUSCULAR | Status: DC | PRN
  Administered 2016-03-11: 4 mg via INTRAVENOUS

## 2016-03-11 MED ORDER — EPHEDRINE SULFATE 50 MG/ML IJ SOLN
INTRAMUSCULAR | Status: DC | PRN
  Administered 2016-03-11 (×2): 5 mg via INTRAVENOUS

## 2016-03-11 MED ORDER — FENTANYL CITRATE (PF) 100 MCG/2ML IJ SOLN
INTRAMUSCULAR | Status: DC | PRN
  Administered 2016-03-11: 50 ug via INTRAVENOUS

## 2016-03-11 MED ORDER — SODIUM CHLORIDE 0.9 % IR SOLN
Status: DC | PRN
  Administered 2016-03-11: 3000 mL

## 2016-03-11 MED ORDER — MIDAZOLAM HCL 2 MG/2ML IJ SOLN
INTRAMUSCULAR | Status: AC
  Filled 2016-03-11: qty 2

## 2016-03-11 MED ORDER — CIPROFLOXACIN IN D5W 400 MG/200ML IV SOLN
INTRAVENOUS | Status: AC
  Filled 2016-03-11: qty 200

## 2016-03-11 MED ORDER — FENTANYL CITRATE (PF) 100 MCG/2ML IJ SOLN
INTRAMUSCULAR | Status: AC
  Filled 2016-03-11: qty 2

## 2016-03-11 MED ORDER — MIDAZOLAM HCL 5 MG/5ML IJ SOLN
INTRAMUSCULAR | Status: DC | PRN
  Administered 2016-03-11: 1 mg via INTRAVENOUS

## 2016-03-11 MED ORDER — ONDANSETRON HCL 4 MG/2ML IJ SOLN
INTRAMUSCULAR | Status: AC
  Filled 2016-03-11: qty 2

## 2016-03-11 MED ORDER — DEXAMETHASONE SODIUM PHOSPHATE 10 MG/ML IJ SOLN
INTRAMUSCULAR | Status: AC
  Filled 2016-03-11: qty 1

## 2016-03-11 MED ORDER — DEXAMETHASONE SODIUM PHOSPHATE 4 MG/ML IJ SOLN
INTRAMUSCULAR | Status: DC | PRN
  Administered 2016-03-11 (×2): 10 mg via INTRAVENOUS

## 2016-03-11 MED ORDER — CIPROFLOXACIN IN D5W 400 MG/200ML IV SOLN
400.0000 mg | INTRAVENOUS | Status: AC
  Administered 2016-03-11: 400 mg via INTRAVENOUS
  Filled 2016-03-11: qty 200

## 2016-03-11 MED ORDER — HYDROCODONE-ACETAMINOPHEN 10-325 MG PO TABS: 1.0000 | ORAL_TABLET | ORAL | 0 refills | Status: AC | PRN

## 2016-03-11 SURGICAL SUPPLY — 44 items
BAG DRAIN URO-CYSTO SKYTR STRL (DRAIN) ×3 IMPLANT
BAG URINE DRAINAGE (UROLOGICAL SUPPLIES) ×3 IMPLANT
BALLN NEPHROSTOMY (BALLOONS) ×3
BALLOON NEPHROSTOMY (BALLOONS) ×2 IMPLANT
BASKET LASER NITINOL 1.9FR (BASKET) IMPLANT
BASKET STNLS GEMINI 4WIRE 3FR (BASKET) IMPLANT
BASKET ZERO TIP NITINOL 2.4FR (BASKET) IMPLANT
CATH FOLEY 2WAY SLVR  5CC 18FR (CATHETERS) ×1
CATH FOLEY 2WAY SLVR 5CC 18FR (CATHETERS) ×2 IMPLANT
CATH INTERMIT  6FR 70CM (CATHETERS) IMPLANT
CATH URET 5FR 28IN CONE TIP (BALLOONS)
CATH URET 5FR 70CM CONE TIP (BALLOONS) IMPLANT
CLOTH BEACON ORANGE TIMEOUT ST (SAFETY) ×3 IMPLANT
ELECT REM PT RETURN 9FT ADLT (ELECTROSURGICAL)
ELECTRODE REM PT RTRN 9FT ADLT (ELECTROSURGICAL) IMPLANT
EVACUATOR MICROVAS BLADDER (UROLOGICAL SUPPLIES) ×3 IMPLANT
FIBER LASER FLEXIVA 1000 (UROLOGICAL SUPPLIES) ×3 IMPLANT
FIBER LASER FLEXIVA 365 (UROLOGICAL SUPPLIES) IMPLANT
FIBER LASER FLEXIVA 550 (UROLOGICAL SUPPLIES) IMPLANT
FIBER LASER TRAC TIP (UROLOGICAL SUPPLIES) IMPLANT
GLOVE BIO SURGEON STRL SZ8 (GLOVE) ×3 IMPLANT
GLOVE BIOGEL PI IND STRL 7.0 (GLOVE) ×2 IMPLANT
GLOVE BIOGEL PI INDICATOR 7.0 (GLOVE) ×1
GLOVE SURG SS PI 7.0 STRL IVOR (GLOVE) ×3 IMPLANT
GOWN STRL REUS W/ TWL LRG LVL3 (GOWN DISPOSABLE) ×2 IMPLANT
GOWN STRL REUS W/ TWL XL LVL3 (GOWN DISPOSABLE) IMPLANT
GOWN STRL REUS W/TWL LRG LVL3 (GOWN DISPOSABLE) ×1
GOWN STRL REUS W/TWL XL LVL3 (GOWN DISPOSABLE)
GOWN XL W/COTTON TOWEL STD (GOWNS) ×3 IMPLANT
GUIDEWIRE 0.038 PTFE COATED (WIRE) IMPLANT
GUIDEWIRE ANG ZIPWIRE 038X150 (WIRE) IMPLANT
GUIDEWIRE STR DUAL SENSOR (WIRE) ×3 IMPLANT
HOLDER FOLEY CATH W/STRAP (MISCELLANEOUS) ×3 IMPLANT
IV NS IRRIG 3000ML ARTHROMATIC (IV SOLUTION) ×6 IMPLANT
KIT BALLIN UROMAX 15FX10 (LABEL) IMPLANT
KIT BALLN UROMAX 15FX4 (MISCELLANEOUS) IMPLANT
KIT BALLN UROMAX 26 75X4 (MISCELLANEOUS)
KIT ROOM TURNOVER WOR (KITS) ×3 IMPLANT
MANIFOLD NEPTUNE II (INSTRUMENTS) ×3 IMPLANT
NS IRRIG 500ML POUR BTL (IV SOLUTION) IMPLANT
PACK CYSTO (CUSTOM PROCEDURE TRAY) ×3 IMPLANT
SET HIGH PRES BAL DIL (LABEL)
TUBE CONNECTING 12X1/4 (SUCTIONS) ×3 IMPLANT
WATER STERILE IRR 3000ML UROMA (IV SOLUTION) IMPLANT

## 2016-03-11 NOTE — Op Note (Signed)
PATIENT:  Bryan Williams  PRE-OPERATIVE DIAGNOSIS:  Bladder calculus  POST-OPERATIVE DIAGNOSIS:   1. Bladder calculus   2. Urethral stricture  PROCEDURE:   1. Cystoscopy with balloon dilatation of urethral stricture  2. Cystolitholapaxy (3.6 cm.)  SURGEON:  Claybon Jabs  INDICATION: Bryan Williams is a  70 year old male who was found to have a bladder stone. He did have some obstructive   voiding symptoms but no recent changes. He is brought to the operating room for cystolitholapaxy  ANESTHESIA:  General  EBL:  Minimal  DRAINS:  18 French Foley  LOCAL MEDICATIONS USED:  None  SPECIMEN:   Stone given to patient  Description of procedure: After informed consent the patient was taken to the operating room and placed on the table in a supine position. General anesthesia was then administered. Once fully anesthetized the patient was moved to the dorsal lithotomy position and the genitalia were sterilely prepped and draped in standard fashion. An official timeout was then performed.  The 23 French cystoscope was passed under direct vision with the 30 lens down the urethra and I encountered a pinpoint urethral stricture in the distal bulbar urethral region. A 0.038 inch sensor guidewire was then passed through the cystoscope and through the narrowed stricture and into the bladder and left in place. Over the guidewire I passed a UroMax dilating balloon and inflated the balloon to 14 atm. This dilated the stricture nicely. I therefore deflated the balloon and remove this and left the guidewire in place.  I was able to advance the cystoscope through the dilated stricture and noted the bulbar urethra proximal to this was noted be normal. The prostatic urethra revealed no evidence of obstructing lateral lobe hypertrophy but a slightly high bladder neck/median lobe component. Upon entering the bladder I noted a single stone on the floor the bladder. Ureteral orifices were noted to be of normal  configuration and position. There were no tumors or inflammatory lesions. A single widemouthed diverticulum was noted on the left posterior wall.  A 1000  holmium laser fiber was then passed through the cystoscope and used to completely fragment the stone. I then used a Scientist, product/process development to remove all stone fragments and reinspection revealed no fragments remaining within the bladder and intact mucosa throughout with no bleeding. The cystoscope was therefore removed and an 38 French Foley catheter was inserted, the balloon filled and the catheter connected to closed system drainage. The patient tolerated the procedure well and there were no intraoperative complications.  He will maintain his Foley catheter for 1 week until he returns for his follow-up visit at which time his catheter will be removed.  PLAN OF CARE: Discharge to home after PACU  PATIENT DISPOSITION:  PACU - hemodynamically stable.

## 2016-03-11 NOTE — Anesthesia Procedure Notes (Signed)
Procedure Name: LMA Insertion Date/Time: 03/11/2016 8:46 AM Performed by: Renella CunasHAZEL, Asani Deniston D Pre-anesthesia Checklist: Patient identified, Emergency Drugs available, Suction available and Patient being monitored Patient Re-evaluated:Patient Re-evaluated prior to inductionOxygen Delivery Method: Circle system utilized Preoxygenation: Pre-oxygenation with 100% oxygen Intubation Type: IV induction Ventilation: Mask ventilation without difficulty LMA: LMA inserted LMA Size: 5.0 Number of attempts: 1 Airway Equipment and Method: Bite block Placement Confirmation: positive ETCO2 Tube secured with: Tape Dental Injury: Teeth and Oropharynx as per pre-operative assessment

## 2016-03-11 NOTE — Discharge Instructions (Signed)
Post Anesthesia Home Care Instructions  Activity: Get plenty of rest for the remainder of the day. A responsible adult should stay with you for 24 hours following the procedure.  For the next 24 hours, DO NOT: -Drive a car -Paediatric nurse -Drink alcoholic beverages -Take any medication unless instructed by your physician -Make any legal decisions or sign important papers.  Meals: Start with liquid foods such as gelatin or soup. Progress to regular foods as tolerated. Avoid greasy, spicy, heavy foods. If nausea and/or vomiting occur, drink only clear liquids until the nausea and/or vomiting subsides. Call your physician if vomiting continues.  Special Instructions/Symptoms: Your throat may feel dry or sore from the anesthesia or the breathing tube placed in your throat during surgery. If this causes discomfort, gargle with warm salt water. The discomfort should disappear within 24 hours.  If you had a scopolamine patch placed behind your ear for the management of post- operative nausea and/or vomiting:  1. The medication in the patch is effective for 72 hours, after which it should be removed.  Wrap patch in a tissue and discard in the trash. Wash hands thoroughly with soap and water. 2. You may remove the patch earlier than 72 hours if you experience unpleasant side effects which may include dry mouth, dizziness or visual disturbances. 3. Avoid touching the patch. Wash your hands with soap and water after contact with the patch.   Foley Catheter Care, Adult A Foley catheter is a soft, flexible tube that is placed into the bladder to drain urine. A Foley catheter may be inserted if:  You leak urine or are not able to control when you urinate (urinary incontinence).  You are not able to urinate when you need to (urinary retention).  You had prostate surgery or surgery on the genitals.  You have certain medical conditions, such as multiple sclerosis, dementia, or a spinal cord  injury. If you are going home with a Foley catheter in place, follow the instructions below. TAKING CARE OF THE CATHETER 1. Wash your hands with soap and water. 2. Using mild soap and warm water on a clean washcloth:  Clean the area on your body closest to the catheter insertion site using a circular motion, moving away from the catheter. Never wipe toward the catheter because this could sweep bacteria up into the urethra and cause infection.  Remove all traces of soap. Pat the area dry with a clean towel. For males, reposition the foreskin. 3. Attach the catheter to your leg so there is no tension on the catheter. Use adhesive tape or a leg strap. If you are using adhesive tape, remove any sticky residue left behind by the previous tape you used. 4. Keep the drainage bag below the level of the bladder, but keep it off the floor. 5. Check throughout the day to be sure the catheter is working and urine is draining freely. Make sure the tubing does not become kinked. 6. Do not pull on the catheter or try to remove it. Pulling could damage internal tissues. TAKING CARE OF THE DRAINAGE BAGS You will be given two drainage bags to take home. One is a large overnight drainage bag, and the other is a smaller leg bag that fits underneath clothing. You may wear the overnight bag at any time, but you should never wear the smaller leg bag at night. Follow the instructions below for how to empty, change, and clean your drainage bags. Emptying the Drainage Bag You must empty your  drainage bag when it is  - full or at least 2-3 times a day. 1. Wash your hands with soap and water. 2. Keep the drainage bag below your hips, below the level of your bladder. This stops urine from going back into the tubing and into your bladder. 3. Hold the dirty bag over the toilet or a clean container. 4. Open the pour spout at the bottom of the bag and empty the urine into the toilet or container. Do not let the pour spout touch  the toilet, container, or any other surface. Doing so can place bacteria on the bag, which can cause an infection. 5. Clean the pour spout with a gauze pad or cotton ball that has rubbing alcohol on it. 6. Close the pour spout. 7. Attach the bag to your leg with adhesive tape or a leg strap. 8. Wash your hands well. Changing the Drainage Bag Change your drainage bag once a month or sooner if it starts to smell bad or look dirty. Below are steps to follow when changing the drainage bag. 1. Wash your hands with soap and water. 2. Pinch off the rubber catheter so that urine does not spill out. 3. Disconnect the catheter tube from the drainage tube at the connection valve. Do not let the tubes touch any surface. 4. Clean the end of the catheter tube with an alcohol wipe. Use a different alcohol wipe to clean the end of the drainage tube. 5. Connect the catheter tube to the drainage tube of the clean drainage bag. 6. Attach the new bag to the leg with adhesive tape or a leg strap. Avoid attaching the new bag too tightly. 7. Wash your hands well. Cleaning the Drainage Bag 1. Wash your hands with soap and water. 2. Wash the bag in warm, soapy water. 3. Rinse the bag thoroughly with warm water. 4. Fill the bag with a solution of white vinegar and water (1 cup vinegar to 1 qt warm water [.2 L vinegar to 1 L warm water]). Close the bag and soak it for 30 minutes in the solution. 5. Rinse the bag with warm water. 6. Hang the bag to dry with the pour spout open and hanging downward. 7. Store the clean bag (once it is dry) in a clean plastic bag. 8. Wash your hands well. PREVENTING INFECTION  Wash your hands before and after handling your catheter.  Take showers daily and wash the area where the catheter enters your body. Do not take baths. Replace wet leg straps with dry ones, if this applies.  Do not use powders, sprays, or lotions on the genital area. Only use creams, lotions, or ointments as  directed by your caregiver.  For females, wipe from front to back after each bowel movement.  Drink enough fluids to keep your urine clear or pale yellow unless you have a fluid restriction.  Do not let the drainage bag or tubing touch or lie on the floor.  Wear cotton underwear to absorb moisture and to keep your skin drier. SEEK MEDICAL CARE IF:   Your urine is cloudy or smells unusually bad.  Your catheter becomes clogged.  You are not draining urine into the bag or your bladder feels full.  Your catheter starts to leak. SEEK IMMEDIATE MEDICAL CARE IF:   You have pain, swelling, redness, or pus where the catheter enters the body.  You have pain in the abdomen, legs, lower back, or bladder.  You have a fever.  You  see blood fill the catheter, or your urine is pink or red.  You have nausea, vomiting, or chills.  Your catheter gets pulled out. MAKE SURE YOU:   Understand these instructions.  Will watch your condition.  Will get help right away if you are not doing well or get worse.   This information is not intended to replace advice given to you by your health care provider. Make sure you discuss any questions you have with your health care provider.   Document Released: 06/03/2005 Document Revised: 10/18/2013 Document Reviewed: 05/25/2012 Elsevier Interactive Patient Education 2016 [employer] Cystoscopy patient instructions  Following a cystoscopy, a catheter (a flexible rubber tube) is sometimes left in place to empty the bladder. This may cause some discomfort or a feeling that you need to urinate. Your doctor determines the period of time that the catheter will be left in place. You may have bloody urine for two to three days (Call your doctor if the amount of bleeding increases or does not subside).  You may pass blood clots in your urine, especially if you had a biopsy. It is not unusual to pass small blood clots and have some bloody urine a couple of weeks  after your cystoscopy. Again, call your doctor if the bleeding does not subside. You may have: Dysuria (painful urination) Frequency (urinating often) Urgency (strong desire to urinate)  These symptoms are common especially if medicine is instilled into the bladder or a ureteral stent is placed. Avoiding alcohol and caffeine, such as coffee, tea, and chocolate, may help relieve these symptoms. Drink plenty of water, unless otherwise instructed. Your doctor may also prescribe an antibiotic or other medicine to reduce these symptoms.  Cystoscopy results are available soon after the procedure; biopsy results usually take two to four days. Your doctor will discuss the results of your exam with you. Before you go home, you will be given specific instructions for follow-up care. Special Instructions:  1 If you are going home with a catheter in place do not take a tub bath until removed by your doctor.  2 You may resume your normal activities.  3 Do not drive or operate machinery if you are taking narcotic pain medicine.  4 Be sure to keep all follow-up appointments with your doctor.   5 Call Your Doctor If: The catheter is not draining  You have severe pain  You are unable to urinate  You have a fever over 101  You have severe bleeding

## 2016-03-11 NOTE — Transfer of Care (Signed)
Immediate Anesthesia Transfer of Care Note  Patient: Bryan Williams  Procedure(s) Performed: Procedure(s) (LRB): CYSTOSCOPY WITH LITHOLAPAXY (N/A) HOLMIUM LASER APPLICATION (N/A) CYSTOSCOPY BALLOON DILATION OF URETHRAL STRICTURE (N/A)  Patient Location: PACU  Anesthesia Type: General  Level of Consciousness: awake, oriented, sedated and patient cooperative  Airway & Oxygen Therapy: Patient Spontanous Breathing and Patient connected to face mask oxygen  Post-op Assessment: Report given to PACU RN and Post -op Vital signs reviewed and stable  Post vital signs: Reviewed and stable  Complications: No apparent anesthesia complications Last Vitals:  Vitals:   03/11/16 0714 03/11/16 0922  BP: (!) 169/92 (!) 150/87  Pulse: (!) 58 (!) 56  Resp: 16 11  Temp: 36.8 C

## 2016-03-12 ENCOUNTER — Encounter (HOSPITAL_BASED_OUTPATIENT_CLINIC_OR_DEPARTMENT_OTHER): Payer: Self-pay | Admitting: Urology

## 2016-03-14 NOTE — Anesthesia Postprocedure Evaluation (Signed)
Anesthesia Post Note  Patient: Bryan Williams  Procedure(s) Performed: Procedure(s) (LRB): CYSTOSCOPY WITH LITHOLAPAXY (N/A) HOLMIUM LASER APPLICATION (N/A) CYSTOSCOPY BALLOON DILATION OF URETHRAL STRICTURE (N/A)  Patient location during evaluation: PACU Anesthesia Type: General Level of consciousness: awake and alert Pain management: pain level controlled Vital Signs Assessment: post-procedure vital signs reviewed and stable Respiratory status: spontaneous breathing, nonlabored ventilation, respiratory function stable and patient connected to nasal cannula oxygen Cardiovascular status: blood pressure returned to baseline and stable Postop Assessment: no signs of nausea or vomiting Anesthetic complications: no    Last Vitals:  Vitals:   03/11/16 1000 03/11/16 1015  BP: 138/82 138/87  Pulse: (!) 52 (!) 49  Resp: 14 12  Temp:      Last Pain:  Vitals:   03/12/16 1023  TempSrc:   PainSc: 0-No pain                 Shawana Knoch,JAMES TERRILL

## 2016-08-06 DIAGNOSIS — Z800 Family history of malignant neoplasm of digestive organs: Secondary | ICD-10-CM | POA: Diagnosis not present

## 2016-08-06 DIAGNOSIS — Z86010 Personal history of colonic polyps: Secondary | ICD-10-CM | POA: Diagnosis not present

## 2016-08-21 DIAGNOSIS — K635 Polyp of colon: Secondary | ICD-10-CM | POA: Diagnosis not present

## 2016-08-21 DIAGNOSIS — Z01818 Encounter for other preprocedural examination: Secondary | ICD-10-CM | POA: Diagnosis not present

## 2016-08-28 DIAGNOSIS — K635 Polyp of colon: Secondary | ICD-10-CM | POA: Diagnosis not present

## 2016-10-01 DIAGNOSIS — R3914 Feeling of incomplete bladder emptying: Secondary | ICD-10-CM | POA: Diagnosis not present

## 2016-10-01 DIAGNOSIS — N401 Enlarged prostate with lower urinary tract symptoms: Secondary | ICD-10-CM | POA: Diagnosis not present

## 2016-10-03 DIAGNOSIS — Z800 Family history of malignant neoplasm of digestive organs: Secondary | ICD-10-CM | POA: Diagnosis not present

## 2016-10-03 DIAGNOSIS — Z86010 Personal history of colonic polyps: Secondary | ICD-10-CM | POA: Diagnosis not present

## 2016-12-06 DIAGNOSIS — M19012 Primary osteoarthritis, left shoulder: Secondary | ICD-10-CM | POA: Diagnosis not present

## 2017-10-02 IMAGING — CR DG SHOULDER 2+V*R*
3 series · 3 of 3 positions shown · non-contrast
Comparison: None.

CLINICAL DATA: Proximal right humeral pain for 1 week, no known
injury, initial encounter

EXAM:
RIGHT SHOULDER - 2+ VIEW

[w shoulder external right]
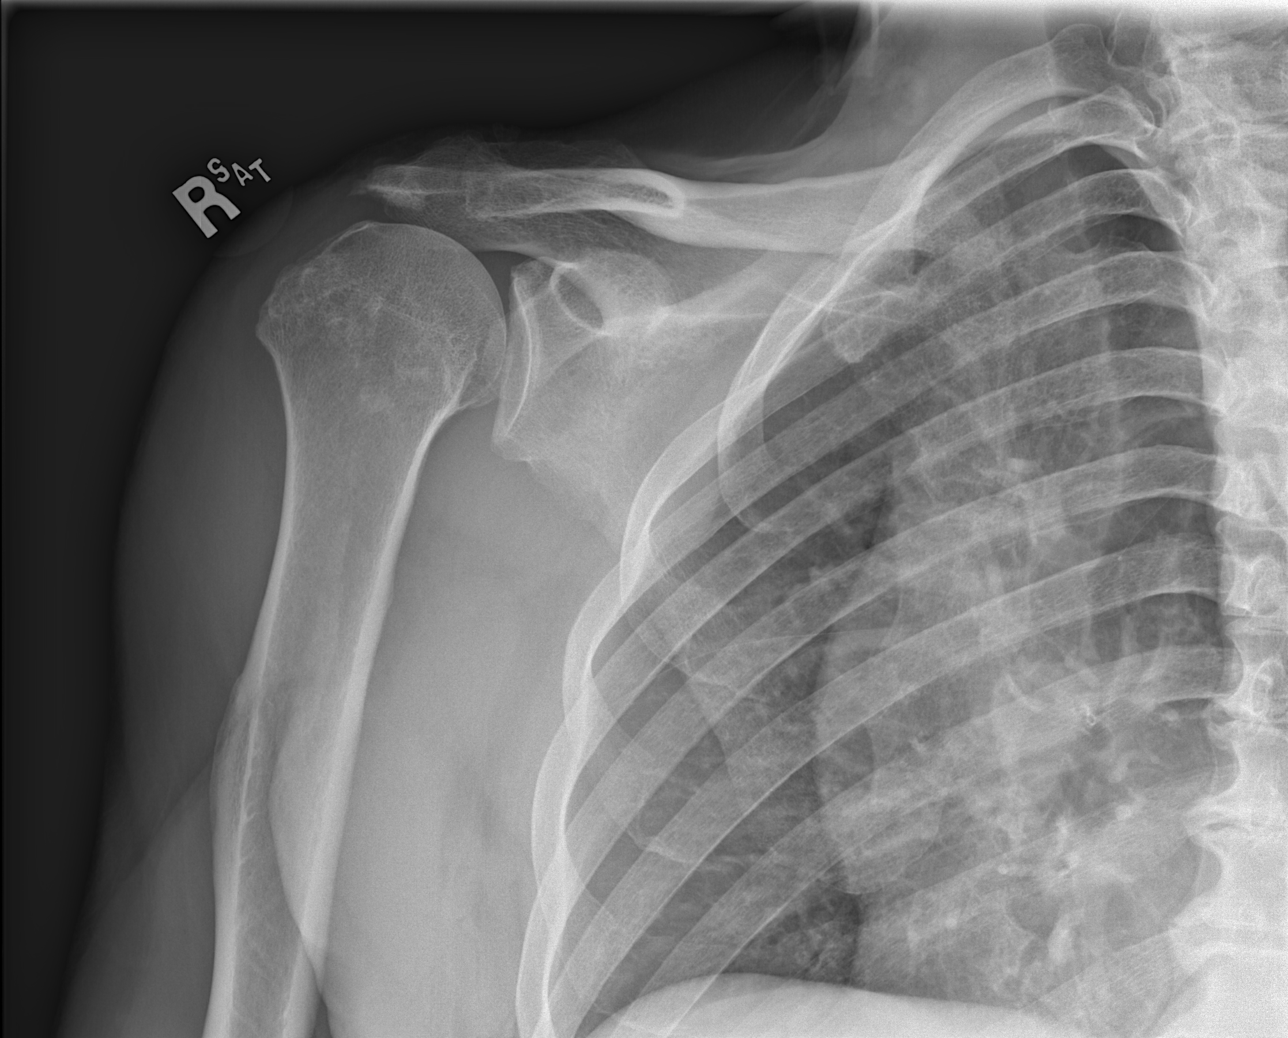

[w shoulder y-view right]
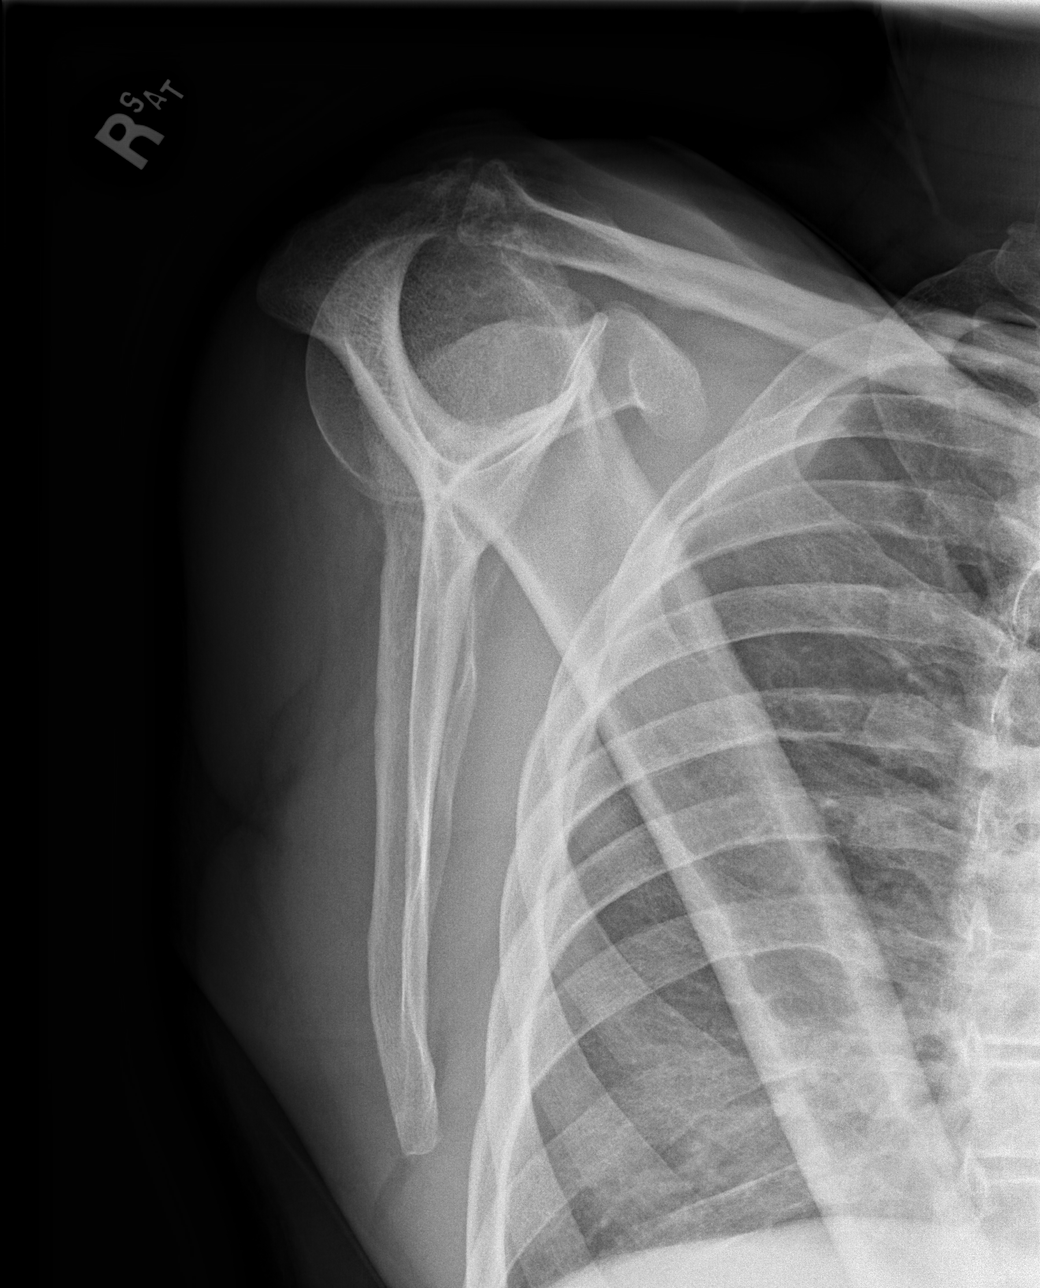

[x shoulder axillary right]
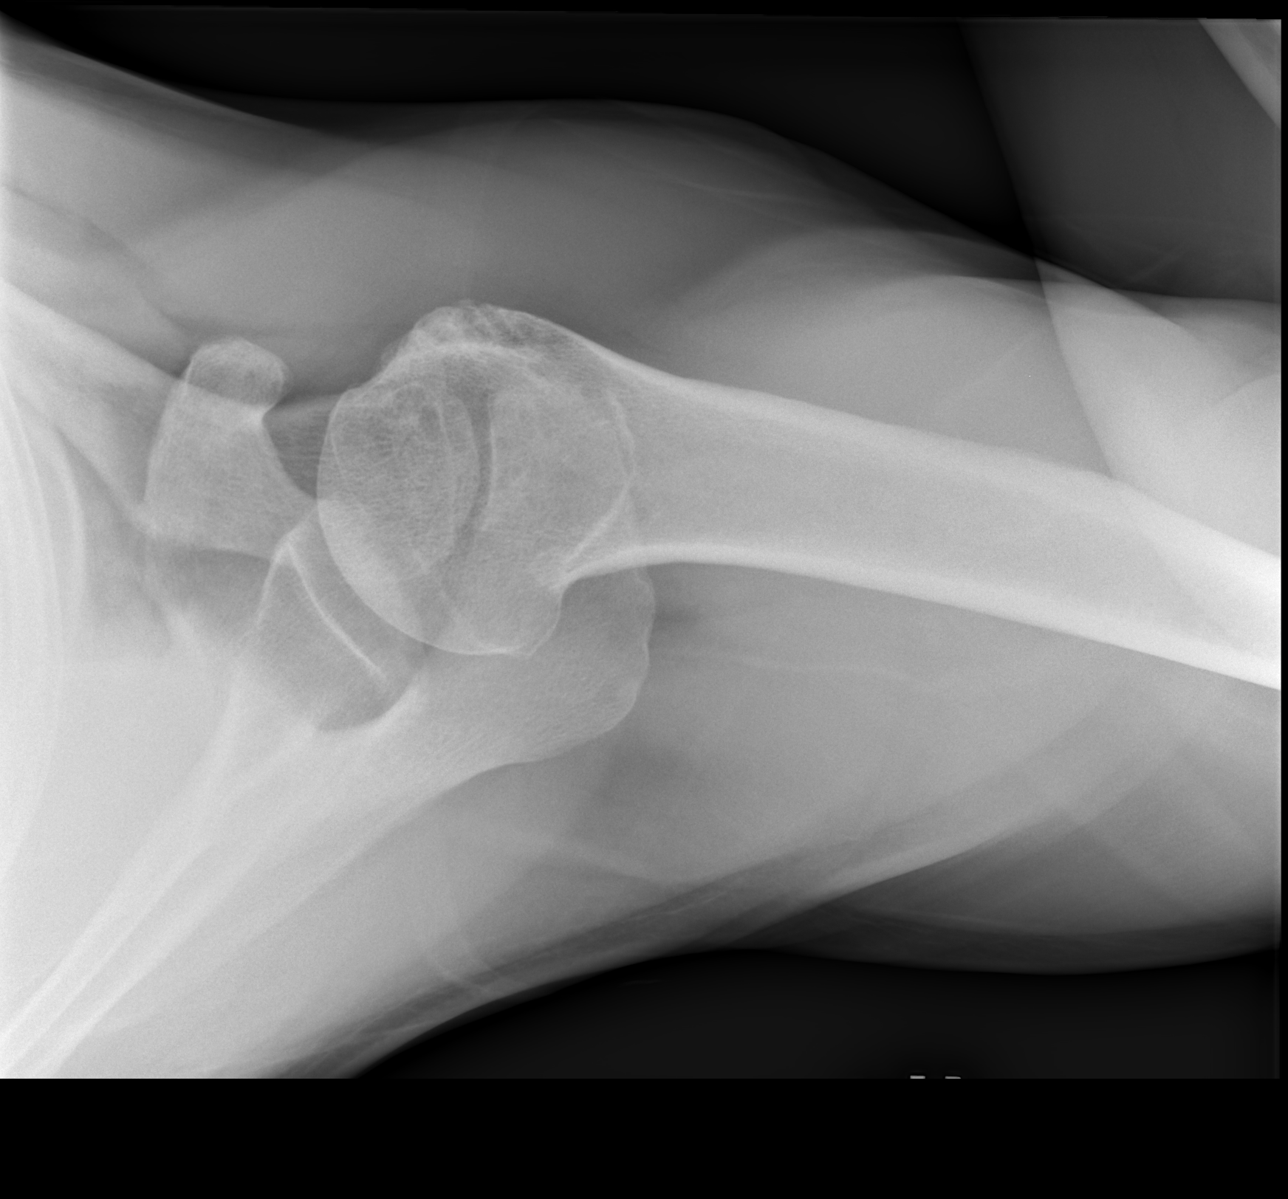

[3 of 3 positions shown; findings below may reference images not displayed]

FINDINGS: Degenerative changes of the acromioclavicular joint are noted. The
humeral head is high-riding which may be related to an under line
rotator cuff injury. Bony thorax is within normal limits.
IMPRESSION: No acute fracture or dislocation is noted.

Humeral head is somewhat high-riding which may be related underlying
rotator cuff injury.

## 2017-11-05 DIAGNOSIS — W57XXXA Bitten or stung by nonvenomous insect and other nonvenomous arthropods, initial encounter: Secondary | ICD-10-CM | POA: Diagnosis not present

## 2017-11-05 DIAGNOSIS — I10 Essential (primary) hypertension: Secondary | ICD-10-CM | POA: Diagnosis not present

## 2017-11-05 DIAGNOSIS — L089 Local infection of the skin and subcutaneous tissue, unspecified: Secondary | ICD-10-CM | POA: Diagnosis not present

## 2017-11-24 DIAGNOSIS — H04121 Dry eye syndrome of right lacrimal gland: Secondary | ICD-10-CM | POA: Diagnosis not present

## 2018-02-09 ENCOUNTER — Encounter (HOSPITAL_COMMUNITY): Payer: Self-pay | Admitting: Emergency Medicine

## 2018-02-09 ENCOUNTER — Emergency Department (HOSPITAL_COMMUNITY)
Admission: EM | Admit: 2018-02-09 | Discharge: 2018-02-10 | Disposition: A | Discharge status: Home / Self Care | Payer: Medicare HMO | Attending: Emergency Medicine | Admitting: Emergency Medicine

## 2018-02-09 ENCOUNTER — Other Ambulatory Visit: Payer: Self-pay

## 2018-02-09 DIAGNOSIS — Z7982 Long term (current) use of aspirin: Secondary | ICD-10-CM | POA: Insufficient documentation

## 2018-02-09 DIAGNOSIS — Z79899 Other long term (current) drug therapy: Secondary | ICD-10-CM | POA: Diagnosis not present

## 2018-02-09 DIAGNOSIS — I10 Essential (primary) hypertension: Secondary | ICD-10-CM

## 2018-02-09 NOTE — ED Triage Notes (Signed)
Pt presents with elevated blood pressure after stopping blood pressure medication on Friday due to tongue swelling and having sweats. Pt not sure of which medication it may be the last documented HTN medication was Norvasc.

## 2018-02-09 NOTE — ED Provider Notes (Signed)
[facility]-EMERGENCY DEPT Provider Note   CSN: [id number] Arrival date & time: 02/09/18  1912     History   Chief Complaint Chief Complaint  Patient presents with  . Hypertension    HPI Bryan Williams is a 72 y.o. male.  HPI   Patient is a 72 year old male with a history of hypertension, GERD and urethral stricture who presents to the emergency department for evaluation of a symptomatic hypertension.  Patient reports that he has been taking 10 mg amlodipine daily for several years now.  He takes the medication in the morning.  3 days ago he had tongue swelling at nighttime.  This resolved on its own, he did not need epinephrine.  He attributed the tongue swelling to the amlodipine and stopped taking this medication.  He also reports that he was bitten by an ant on his shoulder the night that he had the tongue swelling.  Denies any new foods or new medications.  He has been checking his blood pressure at home which has been up to 202/105.  He denies chest pain, shortness of breath, visual disturbance, headache, changes in urination.  He denies any other known allergies.  Past Medical History:  Diagnosis Date  . Bladder calculus   . GERD (gastroesophageal reflux disease)   . History of bladder stone   . History of urethral stricture   . Hypertension   . Right shoulder pain     There are no active problems to display for this patient.   Past Surgical History:  Procedure Laterality Date  . BALLOON DILATION N/A 03/11/2016   Procedure: CYSTOSCOPY BALLOON DILATION OF URETHRAL STRICTURE;  Surgeon: Ihor GullyMark Ottelin, MD;  [facility] [place];  Service: Urology;  Laterality: N/A;  . CYSTO/  URETHRAL DILATATION/  [facility]  LITHOTRIPSY BLADDER STONE  [facility]20-2010   [facility]  . CYSTOSCOPY WITH LITHOLAPAXY N/A 03/11/2016   Procedure: CYSTOSCOPY WITH LITHOLAPAXY;  Surgeon: Ihor GullyMark Ottelin, MD;  [facility] [place];  Service: Urology;  Laterality: N/A;  .  HOLMIUM LASER APPLICATION N/A 03/11/2016   Procedure: HOLMIUM LASER APPLICATION;  Surgeon: Ihor GullyMark Ottelin, MD;  [facility] ;  Service: Urology;  Laterality: N/A;  . INGUINAL HERNIA REPAIR Right 1970's  . KNEE ARTHROSCOPY Right 2015        Home Medications    Prior to Admission medications   Medication Sig Start Date End Date Taking? Authorizing Provider  amLODipine (NORVASC) 10 MG tablet Take 10 mg by mouth every morning.    [provider]  aspirin EC 81 MG tablet Take 81 mg by mouth daily.    [provider]  Cholecalciferol (VITAMIN D PO) Take 1 tablet by mouth daily.     [provider]  famotidine (PEPCID) 20 MG tablet Take 1 tablet (20 mg total) by mouth 2 (two) times daily. 01/16/16   Azalia Bilisampos, Kevin, MD  GARLIC PO Take 1 capsule by mouth daily.     [provider]  HYDROcodone-acetaminophen (NORCO) 10-325 MG tablet Take 1-2 tablets by mouth every 4 (four) hours as needed for moderate pain. Maximum dose per 24 hours - 8 pills 03/11/16   Ihor Gullyttelin, Mark, MD  Omega-3 Fatty Acids (FISH OIL PO) Take 1 capsule by mouth daily.     [provider]  phenazopyridine (PYRIDIUM) 200 MG tablet Take 1 tablet (200 mg total) by mouth 3 (three) times daily as needed for pain. 03/11/16   Ihor Gullyttelin, Mark, MD    Family  History Family History  Problem Relation Age of Onset  . Hypertension Mother   . Hypertension Father   . Diabetes Father     Social History Social History   Tobacco Use  . Smoking status: Never Smoker  . Smokeless tobacco: Never Used  Substance Use Topics  . Alcohol use: Yes    Comment: OCCASIONAL  . Drug use: No     Allergies   Lisinopril   Review of Systems Review of Systems  Eyes: Negative for visual disturbance.  Respiratory: Negative for shortness of breath.   Cardiovascular: Negative for chest pain.  Gastrointestinal: Negative for abdominal pain and vomiting.  Genitourinary: Negative for difficulty  urinating.  Neurological: Negative for syncope, weakness, light-headedness, numbness and headaches.  All other systems reviewed and are negative.    Physical Exam Updated Vital Signs BP (!) 195/103 (BP Location: Left Arm)   Pulse 60   Temp 98.7 F (37.1 C) (Oral)   Resp 16   Ht 5\' 8"  (1.727 m)   Wt 86.2 kg   SpO2 98%   BMI 28.89 kg/m   Physical Exam  Constitutional: He is oriented to person, place, and time. He appears well-developed and well-nourished. No distress.  Sitting at bedside in no apparent distress, conversational.  HENT:  Head: Normocephalic and atraumatic.  Mouth/Throat: Oropharynx is clear and moist.  Eyes: Pupils are equal, round, and reactive to light. Conjunctivae are normal. Right eye exhibits no discharge. Left eye exhibits no discharge.  Neck: Normal range of motion. Neck supple. No JVD present.  Cardiovascular: Normal rate, regular rhythm and intact distal pulses.  Pulmonary/Chest: Effort normal and breath sounds normal. No stridor. No respiratory distress. He has no wheezes. He has no rales.  Abdominal: Soft. Bowel sounds are normal. There is no tenderness.  Musculoskeletal:  No leg swelling.  Neurological: He is alert and oriented to person, place, and time. Coordination normal.  Mental Status:  Alert, oriented, thought content appropriate, able to give a coherent history. Speech fluent without evidence of aphasia. Able to follow 2 step commands without difficulty.  Motor:  Normal tone. 5/5 in upper and lower extremities bilaterally including strong and equal grip strength and dorsiflexion/plantar flexion Sensory: Sensation to light touch normal in all extremities.  Gait: normal gait and balance CV: distal pulses palpable throughout   Skin: Skin is warm and dry. Capillary refill takes less than 2 seconds. He is not diaphoretic.  Psychiatric: He has a normal mood and affect. His behavior is normal.  Nursing note and vitals reviewed.    ED Treatments  / Results  Labs (all labs ordered are listed, but only abnormal results are displayed) Labs Reviewed - No data to display  EKG None  Radiology No results found.  Procedures Procedures (including critical care time)  Medications Ordered in ED Medications - No data to display   Initial Impression / Assessment and Plan / ED Course  I have reviewed the triage vital signs and the nursing notes.  Pertinent labs & imaging results that were available during my care of the patient were reviewed by me and considered in my medical decision making (see chart for details).     Patient presents with hypertension after he stopped taking his amlodipine 2 days ago.  He has taken this medication for many years and has not had a problem with it.  Reports that he had tongue swelling in the evening time, this was after being bitten by an ant.  Doubt that this was  allergic reaction to the amlodipine.  This medication is not known for causing angioedema.  No chest pain, shortness of breath, headache, visual disturbance or changes in urination to suggest hypertensive urgency. He gets regular check ups at [place]. Do not think lab work or imaging is indicated at this time given no symptoms. Plan to restart patient's amlodipine and follow-up with his PCP at the [place].  Discussed return precautions and he agrees and appears reliable.  Final Clinical Impressions(s) / ED Diagnoses   Final diagnoses:  Essential hypertension    ED Discharge Orders    None       Lawrence Marseilles 02/09/18 2306    Tilden Fossa, MD 02/10/18 [phone]

## 2018-02-09 NOTE — Discharge Instructions (Addendum)
Start taking your amlodipine tomorrow morning.  Follow-up with your regular doctor as scheduled.  Return to the ER if you have any new or concerning symptoms like face swelling, tongue swelling, trouble breathing, chest pain, trouble with your vision, headache or changes with your urination.

## 2019-04-16 DIAGNOSIS — Z7189 Other specified counseling: Secondary | ICD-10-CM | POA: Diagnosis not present

## 2019-04-16 DIAGNOSIS — Z79899 Other long term (current) drug therapy: Secondary | ICD-10-CM | POA: Diagnosis not present

## 2019-04-16 DIAGNOSIS — K625 Hemorrhage of anus and rectum: Secondary | ICD-10-CM | POA: Diagnosis not present

## 2019-05-17 DIAGNOSIS — Z20828 Contact with and (suspected) exposure to other viral communicable diseases: Secondary | ICD-10-CM | POA: Diagnosis not present

## 2019-05-17 DIAGNOSIS — Z9189 Other specified personal risk factors, not elsewhere classified: Secondary | ICD-10-CM | POA: Diagnosis not present

## 2019-05-18 DIAGNOSIS — K649 Unspecified hemorrhoids: Secondary | ICD-10-CM | POA: Diagnosis not present

## 2019-05-18 DIAGNOSIS — K625 Hemorrhage of anus and rectum: Secondary | ICD-10-CM | POA: Diagnosis not present

## 2019-05-18 DIAGNOSIS — Z01818 Encounter for other preprocedural examination: Secondary | ICD-10-CM | POA: Diagnosis not present

## 2019-05-18 DIAGNOSIS — Z1211 Encounter for screening for malignant neoplasm of colon: Secondary | ICD-10-CM | POA: Diagnosis not present

## 2019-06-02 DIAGNOSIS — K648 Other hemorrhoids: Secondary | ICD-10-CM | POA: Diagnosis not present

## 2019-06-02 DIAGNOSIS — I10 Essential (primary) hypertension: Secondary | ICD-10-CM | POA: Diagnosis not present

## 2019-07-30 ENCOUNTER — Ambulatory Visit: Payer: Medicare Other | Attending: Internal Medicine

## 2019-07-30 DIAGNOSIS — Z23 Encounter for immunization: Secondary | ICD-10-CM | POA: Insufficient documentation

## 2019-07-30 NOTE — Progress Notes (Signed)
   Covid-19 Vaccination Clinic  Name:  Bryan Williams    MRN: [id number] DOB: 28-Oct-1945  07/30/2019  Mr. Bryan Williams was observed post Covid-19 immunization for 15 minutes without incidence. He was provided with Vaccine Information Sheet and instruction to access the V-Safe system.   Mr. Bryan Williams was instructed to call 911 with any severe reactions post vaccine: Marland Kitchen Difficulty breathing  . Swelling of your face and throat  . A fast heartbeat  . A bad rash all over your body  . Dizziness and weakness    Immunizations Administered    Name Date Dose VIS Date Route   Pfizer COVID-19 Vaccine 07/30/2019  2:00 PM 0.3 mL 05/28/2019 Intramuscular   Manufacturer: [employer], [employer]   Lot: [id number]   NDC: [address]-8

## 2019-08-22 ENCOUNTER — Ambulatory Visit: Payer: Medicare Other | Attending: Internal Medicine

## 2019-08-22 DIAGNOSIS — Z23 Encounter for immunization: Secondary | ICD-10-CM

## 2019-08-22 NOTE — Progress Notes (Signed)
   Covid-19 Vaccination Clinic  Name:  Bryan Williams    MRN: [id number] DOB: 06-25-1945  08/22/2019  [name] was observed post Covid-19 immunization for 15 minutes without incident. He was provided with Vaccine Information Sheet and instruction to access the V-Safe system.   [name] was instructed to call 911 with any severe reactions post vaccine: Marland Kitchen Difficulty breathing  . Swelling of face and throat  . A fast heartbeat  . A bad rash all over body  . Dizziness and weakness   Immunizations Administered    Name Date Dose VIS Date Route   Pfizer COVID-19 Vaccine 08/22/2019  2:11 PM 0.3 mL 05/28/2019 Intramuscular   Manufacturer: [employer], [employer]   Lot: [id number]   NDC: [address]-5

## 2021-10-15 DEATH — deceased
